# Patient Record
Sex: Male | Born: 1980 | Race: Black or African American | Hispanic: No | Marital: Single | Smoking: Never smoker
Health system: Southern US, Community
[De-identification: ages and names within clinical notes are randomized; demographics above are authoritative.]

---

## 2002-12-21 ENCOUNTER — Emergency Department (HOSPITAL_COMMUNITY): Admission: EM | Admit: 2002-12-21 | Discharge: 2002-12-21 | Payer: Self-pay | Admitting: Emergency Medicine

## 2003-01-04 ENCOUNTER — Emergency Department (HOSPITAL_COMMUNITY): Admission: EM | Admit: 2003-01-04 | Discharge: 2003-01-04 | Payer: Self-pay | Admitting: Emergency Medicine

## 2005-09-10 ENCOUNTER — Emergency Department (HOSPITAL_COMMUNITY): Admission: EM | Admit: 2005-09-10 | Discharge: 2005-09-10 | Payer: Self-pay | Admitting: Family Medicine

## 2008-01-12 ENCOUNTER — Emergency Department (HOSPITAL_COMMUNITY): Admission: EM | Admit: 2008-01-12 | Discharge: 2008-01-12 | Payer: Self-pay | Admitting: Emergency Medicine

## 2008-08-25 ENCOUNTER — Emergency Department: Payer: Self-pay | Admitting: Emergency Medicine

## 2008-08-31 ENCOUNTER — Emergency Department (HOSPITAL_COMMUNITY): Admission: EM | Admit: 2008-08-31 | Discharge: 2008-08-31 | Payer: Self-pay | Admitting: Emergency Medicine

## 2008-09-06 ENCOUNTER — Emergency Department (HOSPITAL_COMMUNITY): Admission: EM | Admit: 2008-09-06 | Discharge: 2008-09-06 | Payer: Self-pay | Admitting: Emergency Medicine

## 2018-09-02 ENCOUNTER — Other Ambulatory Visit: Payer: Self-pay

## 2018-09-02 ENCOUNTER — Ambulatory Visit (INDEPENDENT_AMBULATORY_CARE_PROVIDER_SITE_OTHER): Payer: Managed Care, Other (non HMO)

## 2018-09-02 ENCOUNTER — Ambulatory Visit (HOSPITAL_COMMUNITY)
Admission: EM | Admit: 2018-09-02 | Discharge: 2018-09-02 | Disposition: A | Discharge status: Home / Self Care | Payer: Managed Care, Other (non HMO) | Attending: Family Medicine | Admitting: Family Medicine

## 2018-09-02 ENCOUNTER — Encounter (HOSPITAL_COMMUNITY): Payer: Self-pay | Admitting: Emergency Medicine

## 2018-09-02 DIAGNOSIS — S62646A Nondisplaced fracture of proximal phalanx of right little finger, initial encounter for closed fracture: Secondary | ICD-10-CM
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
## 2018-09-02 NOTE — Discharge Instructions (Signed)
You may use over the counter ibuprofen or acetaminophen as needed.  ° °

## 2018-09-03 NOTE — ED Provider Notes (Signed)
[facility]   [id number] 09/02/18 Arrival Time: 1942  ASSESSMENT & PLAN:  1. Closed nondisplaced fracture of proximal phalanx of right little finger, initial encounter     Imaging: Dg Finger Little Right  Result Date: 09/02/2018 CLINICAL DATA:  Trauma EXAM: RIGHT LITTLE FINGER 2+V COMPARISON:  01/12/2008 FINDINGS: Acute mildly displaced intra-articular fracture volar base of the fifth middle phalanx. No subluxation. Soft tissue swelling. IMPRESSION: Acute mildly displaced intra-articular fracture volar base of the fifth middle phalanx. Electronically Signed   By: Jasmine Pang M.D.   On: 09/02/2018 20:21   Finger splint applied.  Follow-up Information    Schedule an appointment as soon as possible for a visit  with Dominica Severin, MD.   Specialty:  Orthopedic Surgery Contact information: [address] [place] [address] [phone]          OTC analgesics if needed. No current pain. May f/u here as needed for other medical problems.  Reviewed expectations re: course of current medical issues. Questions answered. Outlined signs and symptoms indicating need for more acute intervention. Patient verbalized understanding. After Visit Summary given.  SUBJECTIVE: History from: patient. Hunter Ayala is a 37 y.o. male who reports persistent pain of his right 5th finger with swelling. Onset abrupt beginning today. Injury/trama: yes, reports "jamming" this finger on his nephew's elbow. "Felt a snap." Discomfort described as aching without radiation. Extremity sensation changes or weakness: none. Self treatment: none reported. Minimal discomfort not requiring OTC analgesics. Does reports trouble bending this finger well. No open wounds reported.  ROS: As per HPI. All other systems negative.  OBJECTIVE:  Vitals:   09/02/18 2004  BP: 112/69  Pulse: 63  Resp: 18  Temp: 98 F (36.7 C)  TempSrc: Oral  SpO2: 98%    General appearance: alert;  no distress Neck: supple with FROM Extremities: no cyanosis or edema; symmetrical with no gross deformities; tenderness over his mid right 5th finger with mild swelling and no bruising; ROM: slightly decreased; unable to actively fully extend finger CV: normal extremity capillary refill Lungs: unlabored respirations; CTAB Skin: warm and dry Neurologic: normal gait; normal symmetric reflexes in all extremities; normal sensation in all extremities Psychological: alert and cooperative; normal mood and affect  No Known Allergies  PMH: penile laceration  Social History   Socioeconomic History  . Marital status: Single    Spouse name: Not on file  . Number of children: Not on file  . Years of education: Not on file  . Highest education level: Not on file  Occupational History  . Not on file  Social Needs  . Financial resource strain: Not on file  . Food insecurity:    Worry: Not on file    Inability: Not on file  . Transportation needs:    Medical: Not on file    Non-medical: Not on file  Tobacco Use  . Smoking status: Never Smoker  Substance and Sexual Activity  . Alcohol use: Yes  . Drug use: Yes    Types: Marijuana  . Sexual activity: Not on file  Lifestyle  . Physical activity:    Days per week: Not on file    Minutes per session: Not on file  . Stress: Not on file  Relationships  . Social connections:    Talks on phone: Not on file    Gets together: Not on file    Attends religious service: Not on file    Active member of club or organization:  Not on file    Attends meetings of clubs or organizations: Not on file    Relationship status: Not on file  . Intimate partner violence:    Fear of current or ex partner: Not on file    Emotionally abused: Not on file    Physically abused: Not on file    Forced sexual activity: Not on file  Other Topics Concern  . Not on file  Social History Narrative  . Not on file   Family History  Problem Relation Age of Onset  .  Aneurysm Mother    History reviewed. No pertinent surgical history.    Mardella Layman, MD 09/16/18 [phone]

## 2021-01-07 ENCOUNTER — Other Ambulatory Visit: Payer: Self-pay

## 2021-01-07 ENCOUNTER — Emergency Department (HOSPITAL_COMMUNITY)
Admission: EM | Admit: 2021-01-07 | Discharge: 2021-01-07 | Payer: Managed Care, Other (non HMO) | Attending: Emergency Medicine | Admitting: Emergency Medicine

## 2021-01-07 ENCOUNTER — Encounter (HOSPITAL_COMMUNITY): Payer: Self-pay | Admitting: Emergency Medicine

## 2021-01-07 DIAGNOSIS — S0990XA Unspecified injury of head, initial encounter: Secondary | ICD-10-CM

## 2021-01-07 DIAGNOSIS — S0081XA Abrasion of other part of head, initial encounter: Secondary | ICD-10-CM | POA: Insufficient documentation

## 2021-01-07 DIAGNOSIS — Y92410 Unspecified street and highway as the place of occurrence of the external cause: Secondary | ICD-10-CM | POA: Insufficient documentation
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
## 2021-05-14 ENCOUNTER — Other Ambulatory Visit: Payer: Self-pay

## 2021-05-14 ENCOUNTER — Emergency Department (HOSPITAL_COMMUNITY): Payer: Managed Care, Other (non HMO)

## 2021-05-14 ENCOUNTER — Emergency Department (HOSPITAL_COMMUNITY)
Admission: EM | Admit: 2021-05-14 | Discharge: 2021-05-14 | Disposition: A | Discharge status: Home / Self Care | Payer: Managed Care, Other (non HMO) | Attending: Emergency Medicine | Admitting: Emergency Medicine

## 2021-05-14 DIAGNOSIS — S01511A Laceration without foreign body of lip, initial encounter: Secondary | ICD-10-CM | POA: Diagnosis not present

## 2021-05-14 DIAGNOSIS — Z23 Encounter for immunization: Secondary | ICD-10-CM | POA: Insufficient documentation

## 2021-05-14 DIAGNOSIS — Y9229 Other specified public building as the place of occurrence of the external cause: Secondary | ICD-10-CM | POA: Diagnosis not present

## 2021-05-14 DIAGNOSIS — S0990XA Unspecified injury of head, initial encounter: Secondary | ICD-10-CM

## 2021-05-14 DIAGNOSIS — S0181XA Laceration without foreign body of other part of head, initial encounter: Secondary | ICD-10-CM

## 2021-05-14 MED ORDER — TETANUS-DIPHTH-ACELL PERTUSSIS 5-2.5-18.5 LF-MCG/0.5 IM SUSY
0.5000 mL | PREFILLED_SYRINGE | Freq: Once | INTRAMUSCULAR | Status: AC
  Administered 2021-05-14: 0.5 mL via INTRAMUSCULAR
  Filled 2021-05-14: qty 0.5

## 2021-05-14 MED ORDER — LIDOCAINE HCL (PF) 1 % IJ SOLN
30.0000 mL | Freq: Once | INTRAMUSCULAR | Status: AC
  Administered 2021-05-14: 30 mL
  Filled 2021-05-14: qty 30

## 2021-05-14 NOTE — ED Notes (Signed)
Pt returned to room  
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
## 2021-05-14 NOTE — ED Notes (Addendum)
Patient currently in CT °
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
## 2021-05-21 ENCOUNTER — Ambulatory Visit (HOSPITAL_COMMUNITY)
Admission: EM | Admit: 2021-05-21 | Discharge: 2021-05-21 | Disposition: A | Discharge status: Home / Self Care | Payer: Managed Care, Other (non HMO) | Attending: Internal Medicine | Admitting: Internal Medicine

## 2021-05-21 ENCOUNTER — Encounter (HOSPITAL_COMMUNITY): Payer: Self-pay

## 2021-05-21 ENCOUNTER — Other Ambulatory Visit: Payer: Self-pay

## 2021-05-21 DIAGNOSIS — S01511A Laceration without foreign body of lip, initial encounter: Secondary | ICD-10-CM | POA: Diagnosis not present

## 2021-05-21 DIAGNOSIS — S01511D Laceration without foreign body of lip, subsequent encounter: Secondary | ICD-10-CM

## 2021-05-21 MED ORDER — CHLORHEXIDINE GLUCONATE 0.12 % MT SOLN: 15.0000 mL | Freq: Two times a day (BID) | OROMUCOSAL | 0 refills | Status: DC

## 2021-05-21 NOTE — Discharge Instructions (Addendum)
Stop using hydrogen peroxide Use chlorhexidine mouthwash as prescribed Continue to take ibuprofen as needed for pain Return to urgent care if symptoms worsen.
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
## 2022-01-07 ENCOUNTER — Other Ambulatory Visit: Payer: Self-pay

## 2022-01-07 ENCOUNTER — Ambulatory Visit (HOSPITAL_COMMUNITY)
Admission: EM | Admit: 2022-01-07 | Discharge: 2022-01-07 | Disposition: A | Discharge status: Home / Self Care | Payer: Managed Care, Other (non HMO) | Attending: Urgent Care | Admitting: Urgent Care

## 2022-01-07 ENCOUNTER — Encounter (HOSPITAL_COMMUNITY): Payer: Self-pay

## 2022-01-07 DIAGNOSIS — S81811A Laceration without foreign body, right lower leg, initial encounter: Secondary | ICD-10-CM | POA: Diagnosis not present

## 2022-01-07 DIAGNOSIS — M79604 Pain in right leg: Secondary | ICD-10-CM

## 2022-01-07 MED ORDER — NAPROXEN 500 MG PO TABS: 500.0000 mg | ORAL_TABLET | Freq: Two times a day (BID) | ORAL | 0 refills | Status: DC

## 2022-01-07 MED ORDER — LIDOCAINE-EPINEPHRINE 1 %-1:100000 IJ SOLN
INTRAMUSCULAR | Status: AC
  Filled 2022-01-07: qty 1

## 2022-01-07 NOTE — ED Triage Notes (Signed)
TDAP 05/24/2021

## 2022-01-07 NOTE — ED Notes (Signed)
Non stick dsy placed over sutures and secured with tape
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
## 2022-01-07 NOTE — Discharge Instructions (Addendum)

## 2022-01-22 ENCOUNTER — Ambulatory Visit (HOSPITAL_COMMUNITY)
Admission: EM | Admit: 2022-01-22 | Discharge: 2022-01-22 | Disposition: A | Discharge status: Home / Self Care | Payer: Managed Care, Other (non HMO)

## 2022-01-22 ENCOUNTER — Other Ambulatory Visit: Payer: Self-pay

## 2022-01-22 NOTE — ED Triage Notes (Signed)
Pt presents to have 6 sutures removed from right leg.
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
## 2024-01-08 ENCOUNTER — Encounter (HOSPITAL_COMMUNITY): Payer: Self-pay

## 2024-01-08 ENCOUNTER — Ambulatory Visit (HOSPITAL_COMMUNITY): Payer: Self-pay

## 2024-01-08 ENCOUNTER — Ambulatory Visit (HOSPITAL_COMMUNITY)
Admission: EM | Admit: 2024-01-08 | Discharge: 2024-01-08 | Disposition: A | Discharge status: Home / Self Care | Payer: Self-pay | Attending: Family Medicine | Admitting: Family Medicine

## 2024-01-08 DIAGNOSIS — S62316A Displaced fracture of base of fifth metacarpal bone, right hand, initial encounter for closed fracture: Secondary | ICD-10-CM

## 2024-01-08 DIAGNOSIS — M79641 Pain in right hand: Secondary | ICD-10-CM

## 2024-01-08 DIAGNOSIS — M79672 Pain in left foot: Secondary | ICD-10-CM

## 2024-01-08 DIAGNOSIS — S62619A Displaced fracture of proximal phalanx of unspecified finger, initial encounter for closed fracture: Secondary | ICD-10-CM
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
## 2024-01-08 NOTE — ED Notes (Signed)
 Ortho tech called and in route.

## 2024-12-17 ENCOUNTER — Ambulatory Visit (HOSPITAL_COMMUNITY)
Admission: EM | Admit: 2024-12-17 | Discharge: 2024-12-17 | Disposition: A | Discharge status: Home / Self Care | Attending: Family Medicine | Admitting: Family Medicine

## 2024-12-17 ENCOUNTER — Encounter (HOSPITAL_COMMUNITY): Payer: Self-pay | Admitting: Emergency Medicine

## 2024-12-17 ENCOUNTER — Ambulatory Visit (INDEPENDENT_AMBULATORY_CARE_PROVIDER_SITE_OTHER)

## 2024-12-17 DIAGNOSIS — M25561 Pain in right knee: Secondary | ICD-10-CM | POA: Diagnosis not present

## 2024-12-17 MED ORDER — IBUPROFEN 600 MG PO TABS: 600.0000 mg | ORAL_TABLET | Freq: Three times a day (TID) | ORAL | 0 refills | Status: AC | PRN

## 2024-12-17 NOTE — Discharge Instructions (Signed)
 There are no bony abnormalities on the x-rays.  Take ibuprofen  600 mg--1 tab every 8 hours as needed for pain.  Please call one of the orthopedic offices listed for further evaluation
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
